# Patient Record
Sex: Female | Born: 2000 | Race: Black or African American | Hispanic: No | Marital: Single | Smoking: Never smoker
Health system: Southern US, Community
[De-identification: ages and names within clinical notes are randomized; demographics above are authoritative.]

## PROBLEM LIST (undated history)

## (undated) DIAGNOSIS — T7840XA Allergy, unspecified, initial encounter: Secondary | ICD-10-CM

## (undated) DIAGNOSIS — J45909 Unspecified asthma, uncomplicated: Secondary | ICD-10-CM

## (undated) DIAGNOSIS — L309 Dermatitis, unspecified: Secondary | ICD-10-CM

## (undated) HISTORY — DX: Allergy, unspecified, initial encounter: T78.40XA

---
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
## 2021-01-31 ENCOUNTER — Ambulatory Visit: Payer: Self-pay | Admitting: Allergy and Immunology

## 2022-06-14 ENCOUNTER — Ambulatory Visit: Payer: BC Managed Care – PPO | Admitting: Radiology

## 2022-06-14 ENCOUNTER — Encounter: Payer: Self-pay | Admitting: Radiology

## 2022-06-14 VITALS — BP 116/64 | Ht 65.5 in | Wt 122.0 lb

## 2022-06-14 DIAGNOSIS — N761 Subacute and chronic vaginitis: Secondary | ICD-10-CM | POA: Diagnosis not present

## 2022-06-14 DIAGNOSIS — Z113 Encounter for screening for infections with a predominantly sexual mode of transmission: Secondary | ICD-10-CM | POA: Diagnosis not present

## 2022-06-14 DIAGNOSIS — N912 Amenorrhea, unspecified: Secondary | ICD-10-CM

## 2022-06-14 DIAGNOSIS — N926 Irregular menstruation, unspecified: Secondary | ICD-10-CM | POA: Diagnosis not present

## 2022-06-14 LAB — WET PREP FOR TRICH, YEAST, CLUE

## 2022-06-14 LAB — PREGNANCY, URINE: Preg Test, Ur: NEGATIVE

## 2022-06-14 MED ORDER — JUNEL FE 24 1-20 MG-MCG(24) PO TABS: 1.0000 | ORAL_TABLET | Freq: Every day | ORAL | 0 refills | Status: DC

## 2022-06-14 MED ORDER — METRONIDAZOLE 0.75 % VA GEL: 1.0000 | Freq: Every day | VAGINAL | 0 refills | Status: AC

## 2022-06-14 MED ORDER — FLUCONAZOLE 150 MG PO TABS: 150.0000 mg | ORAL_TABLET | Freq: Once | ORAL | 0 refills | Status: AC
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
## 2022-06-15 LAB — SURESWAB CT/NG/T. VAGINALIS
C. trachomatis RNA, TMA: NOT DETECTED
N. gonorrhoeae RNA, TMA: NOT DETECTED
Trichomonas vaginalis RNA: NOT DETECTED

## 2022-08-29 ENCOUNTER — Other Ambulatory Visit: Payer: Self-pay | Admitting: Radiology

## 2022-08-29 DIAGNOSIS — N926 Irregular menstruation, unspecified: Secondary | ICD-10-CM

## 2022-08-29 NOTE — Telephone Encounter (Signed)
Message sent to appt desk that she needs to schedule follow visit with Wende Crease, NP and only one mos supply bcp provided.  Refill sent.

## 2022-08-29 NOTE — Telephone Encounter (Signed)
May send 1 month supply, need appt

## 2022-08-29 NOTE — Telephone Encounter (Signed)
Jami, NP recommended 3 mos follow up. No appt is scheduled.

## 2022-08-30 MED ORDER — HAILEY 24 FE 1-20 MG-MCG(24) PO TABS: 1.0000 | ORAL_TABLET | Freq: Every day | ORAL | 0 refills | Status: DC

## 2022-08-30 NOTE — Telephone Encounter (Signed)
Called pharmacy and let them know they could cancel the one mos supply sent yesterday as Jami,NP did okay the original request for #84 with no refills. Rx sent.

## 2022-08-30 NOTE — Telephone Encounter (Signed)
Ok to refill for 3 months.  

## 2022-08-30 NOTE — Addendum Note (Signed)
Addended by: Ramond Craver on: 08/30/2022 03:29 PM   Modules accepted: Orders

## 2022-08-30 NOTE — Telephone Encounter (Signed)
[name] [name] called to schedule the follow up visit  but the number on chart is Mom's phone number.  Mom said patient is in school in [place]. 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 Not sure when she would be able to get home again.  Please advise regarding bcp refills.

## 2022-11-25 ENCOUNTER — Other Ambulatory Visit: Payer: Self-pay | Admitting: Radiology

## 2022-11-25 DIAGNOSIS — N926 Irregular menstruation, unspecified: Secondary | ICD-10-CM

## 2023-02-01 ENCOUNTER — Other Ambulatory Visit: Payer: Self-pay

## 2023-02-01 DIAGNOSIS — N926 Irregular menstruation, unspecified: Secondary | ICD-10-CM

## 2023-02-01 MED ORDER — HAILEY 24 FE 1-20 MG-MCG(24) PO TABS: 1.0000 | ORAL_TABLET | Freq: Every day | ORAL | 0 refills | Status: DC

## 2023-02-01 NOTE — Telephone Encounter (Signed)
Medication refill request: [name] [name] 71fe Last OV:  06-14-22 Next AEX: not scheduled Last MMG (if hormonal medication request): none Refill authorized: at OV on 06-14-22, pt was told to come back in for another OV after being started on birth control. Patient did not come back. Rx was refilled 09-10-22 supply with 0 refills. Rx refill request 12-18-22 was denied. I called patient regarding her not coming back or follow up & also that she should have been out of ocps since June 2024.  Waiting on response from patient. Routing to [place], NP as [employer]

## 2023-04-23 ENCOUNTER — Other Ambulatory Visit: Payer: Self-pay | Admitting: Radiology

## 2023-04-23 DIAGNOSIS — N926 Irregular menstruation, unspecified: Secondary | ICD-10-CM

## 2023-04-23 NOTE — Telephone Encounter (Signed)
Medication refill request: [name] [name]  Last OV:  06/14/22 Next AEX: not scheduled  Last MMG (if hormonal medication request): n/a Refill authorized: #84 with 0 rf pended for today. Note sent to pharmacy that patient needs to schedule appointment

## 2023-06-15 ENCOUNTER — Other Ambulatory Visit: Payer: Self-pay

## 2023-06-15 ENCOUNTER — Encounter (HOSPITAL_COMMUNITY): Payer: Self-pay

## 2023-06-15 ENCOUNTER — Emergency Department (HOSPITAL_COMMUNITY): Payer: BLUE CROSS/BLUE SHIELD

## 2023-06-15 ENCOUNTER — Emergency Department (HOSPITAL_COMMUNITY)
Admission: EM | Admit: 2023-06-15 | Discharge: 2023-06-15 | Disposition: A | Discharge status: Home / Self Care | Payer: BLUE CROSS/BLUE SHIELD | Attending: Emergency Medicine | Admitting: Emergency Medicine

## 2023-06-15 DIAGNOSIS — R0789 Other chest pain: Secondary | ICD-10-CM | POA: Diagnosis not present

## 2023-06-15 DIAGNOSIS — J45909 Unspecified asthma, uncomplicated: Secondary | ICD-10-CM | POA: Insufficient documentation

## 2023-06-15 DIAGNOSIS — Z91010 Allergy to peanuts: Secondary | ICD-10-CM | POA: Insufficient documentation

## 2023-06-15 DIAGNOSIS — R079 Chest pain, unspecified: Secondary | ICD-10-CM | POA: Diagnosis present

## 2023-06-15 LAB — BASIC METABOLIC PANEL
Anion gap: 10 (ref 5–15)
BUN: 9 mg/dL (ref 6–20)
CO2: 23 mmol/L (ref 22–32)
Calcium: 9.5 mg/dL (ref 8.9–10.3)
Chloride: 103 mmol/L (ref 98–111)
Creatinine, Ser: 0.79 mg/dL (ref 0.44–1.00)
GFR, Estimated: >60 mL/min (ref >60)
Glucose, Bld: 147 mg/dL — ABNORMAL HIGH (ref 70–99)
Potassium: 3.5 mmol/L (ref 3.5–5.1)
Sodium: 136 mmol/L (ref 135–145)

## 2023-06-15 LAB — CBC
HCT: 36.3 % (ref 36.0–46.0)
Hemoglobin: 12.1 g/dL (ref 12.0–15.0)
MCH: 27.3 pg (ref 26.0–34.0)
MCHC: 33.3 g/dL (ref 30.0–36.0)
MCV: 81.9 fL (ref 80.0–100.0)
Platelets: 347 10*3/uL (ref 150–400)
RBC: 4.43 MIL/uL (ref 3.87–5.11)
RDW: 13.1 % (ref 11.5–15.5)
WBC: 5.9 10*3/uL (ref 4.0–10.5)
nRBC: 0 % (ref 0.0–0.2)

## 2023-06-15 LAB — TROPONIN I (HIGH SENSITIVITY)
Troponin I (High Sensitivity): <2 ng/L (ref <18)
Troponin I (High Sensitivity): 3 ng/L (ref <18)

## 2023-06-15 LAB — HCG, SERUM, QUALITATIVE: Preg, Serum: NEGATIVE

## 2023-06-15 MED ORDER — BENZONATATE 100 MG PO CAPS: 100.0000 mg | ORAL_CAPSULE | Freq: Three times a day (TID) | ORAL | 0 refills | Status: AC | PRN

## 2023-06-15 MED ORDER — NAPROXEN 500 MG PO TABS: 500.0000 mg | ORAL_TABLET | Freq: Two times a day (BID) | ORAL | 0 refills | Status: AC | PRN

## 2023-06-15 NOTE — Discharge Instructions (Addendum)
Your evaluation in the emergency department today was reassuring.  We recommend naproxen as prescribed for management of pain.  You may use Tessalon as prescribed for persistent cough symptoms.  Follow-up with a primary care doctor.  Return for any new or concerning symptoms.

## 2023-06-15 NOTE — ED Triage Notes (Signed)
Reports chest pain that started today. Patient is on the right side of her chest, under her breast, and radiates to the back. She also reports having a cough for a few weeks. No blood thinners. Rates pain 6/10.

## 2023-06-15 NOTE — ED Provider Notes (Signed)
[place] EMERGENCY DEPARTMENT AT [facility] Provider Note   CSN: [id number] Arrival date & time: 06/15/23  1935     History  Chief Complaint  Patient presents with   Chest Pain    [name] [name] is a 22 y.o. female.  22 year old female with a history of eczema, asthma, allergies presents to the emergency department for evaluation of chest pain.  She began to notice an aching pain in her right anterior chest, under her right breast, went out to lunch with her mother today.  Pain has been fairly constant.  She has some radiation of her pain to the back.  It is aggravated with breathing, cough as well as with pressure from her bra strap.  Reports experiencing an ongoing cough over the past few weeks. She has not taken any medications for her symptoms.  Denies any aggravation with eating or drinking.  No associated abdominal pain, nausea, vomiting, hemoptysis, fevers.  She has not had any surgeries or hospitalizations in the past 6 months.  Denies the use of birth control, tobacco products, illicit drugs.  No known FHx of sudden cardiac death at a young age.  The history is provided by the patient and a parent. No language interpreter was used.  Chest Pain      Home Medications Prior to Admission medications   Medication Sig Start Date End Date Taking? Authorizing Provider  benzonatate (TESSALON) 100 MG capsule Take 1 capsule (100 mg total) by mouth 3 (three) times daily as needed for cough. 06/15/23  Yes Antony Madura, PA-C  naproxen (NAPROSYN) 500 MG tablet Take 1 tablet (500 mg total) by mouth 2 (two) times daily as needed for moderate pain (pain score 4-6) or mild pain (pain score 1-3). 06/15/23  Yes Antony Madura, PA-C  albuterol (PROVENTIL HFA;VENTOLIN HFA) 108 (90 Base) MCG/ACT inhaler Inhale 4 puffs into the lungs every 4 (four) hours. 09/14/16   Neomia Glass, MD  EPINEPHrine 0.3 mg/0.3 mL IJ SOAJ injection Inject 0.3 mLs (0.3 mg total) into the muscle as needed for  anaphylaxis. 10/29/19   Alfonse Spruce, MD  HAILEY 24 FE 1-20 MG-MCG(24) tablet TAKE 1 TABLET BY MOUTH EVERY DAY 04/23/23   Chrzanowski, Jami B, NP  levocetirizine (XYZAL) 5 MG tablet Take 5 mg by mouth at bedtime. 10/28/19   [provider]  montelukast (SINGULAIR) 10 MG tablet Take 10 mg by mouth at bedtime. 10/24/19   [provider]  triamcinolone cream (KENALOG) 0.1 % Apply 1 application topically 2 (two) times daily. 10/29/19   Alfonse Spruce, MD      Allergies    Other, Peanut-containing drug products, and Shellfish allergy    Review of Systems   Review of Systems  Cardiovascular:  Positive for chest pain.  Ten systems reviewed and are negative for acute change, except as noted in the HPI.    Physical Exam Updated Vital Signs BP 124/71 (BP Location: Right Arm)   Pulse 60   Temp 97.6 F (36.4 C)   Resp 20   Ht 5\' 7"  (1.702 m)   Wt 56.2 kg   LMP 06/02/2023 (Approximate)   SpO2 98%   BMI 19.42 kg/m   Physical Exam Vitals and nursing note reviewed.  Constitutional:      General: She is not in acute distress.    Appearance: She is well-developed. She is not diaphoretic.     Comments: Nontoxic appearing and in NAD  HENT:     Head: Normocephalic and atraumatic.  Eyes:     General: No scleral icterus.    Conjunctiva/sclera: Conjunctivae normal.  Cardiovascular:     Rate and Rhythm: Normal rate and regular rhythm.     Pulses: Normal pulses.  Pulmonary:     Effort: Pulmonary effort is normal. No respiratory distress.     Breath sounds: No stridor. No wheezing.     Comments: Respirations even and unlabored. Lungs CTAB. No crepitus to chest wall. Abdominal:     Comments: Abdomen soft, nondistended, nontender. No guarding.  Musculoskeletal:        General: Normal range of motion.     Cervical back: Normal range of motion.  Skin:    General: Skin is warm and dry.     Coloration: Skin is not pale.     Findings: No erythema or rash.   Neurological:     Mental Status: She is alert and oriented to person, place, and time.     Coordination: Coordination normal.  Psychiatric:        Behavior: Behavior normal.     ED Results / Procedures / Treatments   Labs (all labs ordered are listed, but only abnormal results are displayed) Labs Reviewed  BASIC METABOLIC PANEL - Abnormal; Notable for the following components:      Result Value   Glucose, Bld 147 (*)    All other components within normal limits  CBC  HCG, SERUM, QUALITATIVE  TROPONIN I (HIGH SENSITIVITY)  TROPONIN I (HIGH SENSITIVITY)    EKG EKG Interpretation Date/Time:  Saturday June 15 2023 19:46:48 EST Ventricular Rate:  87 PR Interval:  156 QRS Duration:  82 QT Interval:  332 QTC Calculation: 399 R Axis:   95  Text Interpretation: Normal sinus rhythm with sinus arrhythmia Rightward axis Borderline ECG No previous ECGs available Confirmed by Gilda Crease [phone]) on 06/15/2023 11:33:14 PM  Radiology DG Chest 2 View Result Date: 06/15/2023 CLINICAL DATA:  Chest pain. EXAM: CHEST - 2 VIEW COMPARISON:  Chest radiograph dated April 23, 2012. FINDINGS: The heart size and mediastinal contours are within normal limits. Both lungs are clear. No pleural effusion or pneumothorax. The visualized skeletal structures are unremarkable. IMPRESSION: No active cardiopulmonary disease. Electronically Signed   By: Hart Robinsons M.D.   On: 06/15/2023 20:18    Procedures Procedures    Medications Ordered in ED Medications - No data to display  ED Course/ Medical Decision Making/ A&P                                 Medical Decision Making Amount and/or Complexity of Data Reviewed Labs: ordered. Radiology: ordered.  Risk Prescription drug management.   This patient presents to the ED for concern of R sided chest pain, this involves an extensive number of treatment options, and is a complaint that carries with it a high risk of  complications and morbidity.  The differential diagnosis includes costochondritis vs PTX vs PNA vs pleural effusion vs biliary pathology   Co morbidities that complicate the patient evaluation  Asthma Allergies   Additional history obtained:  Additional history obtained from mother, at bedside   Lab Tests:  I Ordered, and personally interpreted labs.  The pertinent results include:  Glucose 147   Imaging Studies ordered:  I ordered imaging studies including CXR  I independently visualized and interpreted imaging which showed no acute cardiopulmonary abnormality I agree with the radiologist interpretation   Cardiac Monitoring:  The patient was maintained on a cardiac monitor.  I personally viewed and interpreted the cardiac monitored which showed an underlying rhythm of: NSR   Medicines ordered and prescription drug management:  I have reviewed the patients home medicines and have made adjustments as needed   Test Considered:  D dimer - however, low suspicion for PE. PERC negative.   Problem List / ED Course:  Patient presents to the emergency department for evaluation of chest pain which began this afternoon.  Low suspicion for cardiac etiology given reassuring workup today.  Symptoms atypical for ACS. EKG is nonischemic and troponin negative x2.   Chest x-ray without evidence of mediastinal widening to suggest dissection.  No pneumothorax, pneumonia, pleural effusion.  Heart size normal.  No concern for pericardial effusion, tamponade. Pulmonary embolus further considered; however, patient without tachycardia, tachypnea, dyspnea, hypoxia.  Patient is PERC negative. No RUQ TTP to suggest biliary pathology. No leukocytosis or fever. Doubt acute cholecystitis, cholangitis. Suspect MSK/costochondral component given cough over the past few weeks.   Reevaluation:  After the interventions noted above, I reevaluated the patient and found that they have :stayed the  same   Social Determinants of Health:  Good social support; mother at bedside   Dispostion:  After consideration of the diagnostic results and the patients response to treatment, I feel that the patent would benefit from Naproxen PRN for pain. Given Tessalon for cough management. Stable for f/u with PCP. Return precautions discussed and provided. Patient discharged in stable condition with no unaddressed concerns.          Final Clinical Impression(s) / ED Diagnoses Final diagnoses:  Atypical chest pain    Rx / DC Orders ED Discharge Orders          Ordered    benzonatate (TESSALON) 100 MG capsule  3 times daily PRN        06/15/23 2313    naproxen (NAPROSYN) 500 MG tablet  2 times daily PRN        06/15/23 2313              Antony Madura, PA-C 06/15/23 2333    Gilda Crease, MD 06/16/23 [phone]

## 2023-06-15 NOTE — ED Notes (Signed)
Pt complaining of a cough for over a week now she has pain in the epigastric area that goes through to the back.

## 2023-06-24 ENCOUNTER — Ambulatory Visit (HOSPITAL_COMMUNITY): Payer: Self-pay
